# Patient Record
Sex: Male | Born: 1990 | Race: White | Hispanic: Yes | Marital: Married | Smoking: Light tobacco smoker
Health system: Southern US, Community
[De-identification: ages and names within clinical notes are randomized; demographics above are authoritative.]

---

## 2014-01-31 ENCOUNTER — Encounter: Payer: Self-pay | Admitting: Internal Medicine

## 2014-01-31 ENCOUNTER — Ambulatory Visit: Payer: Self-pay | Attending: Internal Medicine | Admitting: Internal Medicine

## 2014-01-31 VITALS — BP 124/85 | HR 66 | Temp 98.2°F | Resp 16 | Ht 67.5 in | Wt 119.0 lb

## 2014-01-31 DIAGNOSIS — F32A Depression, unspecified: Secondary | ICD-10-CM

## 2014-01-31 DIAGNOSIS — F17210 Nicotine dependence, cigarettes, uncomplicated: Secondary | ICD-10-CM | POA: Insufficient documentation

## 2014-01-31 DIAGNOSIS — F419 Anxiety disorder, unspecified: Secondary | ICD-10-CM | POA: Insufficient documentation

## 2014-01-31 DIAGNOSIS — F329 Major depressive disorder, single episode, unspecified: Secondary | ICD-10-CM | POA: Insufficient documentation

## 2014-01-31 MED ORDER — CLONAZEPAM 0.5 MG PO TABS: 1.0000 mg | ORAL_TABLET | Freq: Two times a day (BID) | ORAL | Status: AC | PRN

## 2014-01-31 MED ORDER — CITALOPRAM HYDROBROMIDE 20 MG PO TABS: 20.0000 mg | ORAL_TABLET | Freq: Every day | ORAL | Status: AC

## 2014-01-31 NOTE — Progress Notes (Signed)
Patient ID: Jerry Phillips, male   DOB: 1991/01/05, 23 y.o.   MRN: [id number]  BJY:[id number]  [id number]  DOB - 1991/01/05  CC:  Chief Complaint  Patient presents with  . Establish Care       HPI: Jerry SayresSergio Phillips is a 23 y.o. male here today to establish medical care.  Patient has a past medical history of depression/anxiety and is requesting refills of his Celexa today.  He was diagnosed with depression four months ago [place].  He reports that he was having symptoms of anxiety, responding quickly, and feelings of anxiety in crowds. He reports that he gets very anxious when he sees crowds of people at at any location.  He has feelings of worthless and hopelessness.  He denies SI and HI.  The Celexa and Klonopin helps to calm his fears and make him feel less anxious.     Patient has No headache, No chest pain, No abdominal pain - No Nausea, No new weakness tingling or numbness, No Cough - SOB.  No Known Allergies History reviewed. No pertinent past medical history. No current outpatient prescriptions on file prior to visit.   No current facility-administered medications on file prior to visit.   Family History  Problem Relation Age of Onset  . Cancer Paternal Grandfather    History   Social History  . Marital Status: Married    Spouse Name: N/A    Number of Children: N/A  . Years of Education: N/A   Occupational History  . Not on file.   Social History Main Topics  . Smoking status: Light Tobacco Smoker  . Smokeless tobacco: Not on file  . Alcohol Use: 1.2 oz/week    2 Not specified per week  . Drug Use: No  . Sexual Activity: Yes   Other Topics Concern  . Not on file   Social History Narrative  . No narrative on file    Review of Systems  Psychiatric/Behavioral: Positive for depression. Negative for suicidal ideas and substance abuse. The patient is nervous/anxious.   All other systems reviewed and are negative.  .     Objective:   Filed Vitals:   01/31/14 0959  BP: 124/85  Pulse: 66  Temp: 98.2 F (36.8 C)  Resp: 16    Physical Exam  Constitutional: He is oriented to person, place, and time.  Neck: No thyromegaly present.  Cardiovascular: Normal rate, regular rhythm and normal heart sounds.   Pulmonary/Chest: Effort normal and breath sounds normal.  Abdominal: Soft. Bowel sounds are normal.  Lymphadenopathy:    He has no cervical adenopathy.  Neurological: He is alert and oriented to person, place, and time.  Skin: Skin is warm and dry.  Psychiatric: He has a normal mood and affect.     No results found for: WBC, HGB, HCT, MCV, PLT No results found for: CREATININE, BUN, NA, K, CL, CO2  No results found for: HGBA1C Lipid Panel  No results found for: CHOL, TRIG, HDL, CHOLHDL, VLDL, LDLCALC     Assessment and plan:   Jerry Phillips was seen today for establish care.  Diagnoses and associated orders for this visit:  Depression/Anxiety - citalopram (CELEXA) 20 MG tablet; Take 1 tablet (20 mg total) by mouth daily. - clonazePAM (KLONOPIN) 0.5 MG tablet; Take 2 tablets (1 mg total) by mouth 2 (two) times daily as needed for anxiety. Discussed with patient that anxiety symptoms are the body's response to stress.  Discussed that benzo's are  habit forming and should only be used when symptoms are severe and this will not be a cyclic fill drug. I have therefore increased the SSRI in hopes that patient will find less need for klonopin.  [facility]  Will follow up with patient in 4 weeks to determine further needs.    Return in about 2 months (around 04/02/2014) for depression f/u.    Holland CommonsKECK, VALERIE, NP-C [facility] [phone] 01/31/2014, 10:35 AM

## 2014-01-31 NOTE — Progress Notes (Signed)
Pt is here today to establish care. Pt is here to refill his medications. Pt has a history of depression and anxiety.

## 2014-01-31 NOTE — Patient Instructions (Signed)
I have increased your citalopram to 20 mg daily with hopes that this will decrease the need for Klonopin.  I have given you information to a counselor. I believe this will help with you social anxiety.  The plan is to wean you off Klonopin unless the counselor feels like you need to continue the medication.  It was nice to meet you today.

## 2014-02-03 ENCOUNTER — Encounter: Payer: Self-pay | Admitting: Internal Medicine

## 2015-06-23 ENCOUNTER — Ambulatory Visit: Payer: Self-pay | Admitting: Internal Medicine

## 2016-05-04 ENCOUNTER — Encounter (HOSPITAL_COMMUNITY): Payer: Self-pay | Admitting: Emergency Medicine

## 2016-05-04 ENCOUNTER — Emergency Department (HOSPITAL_COMMUNITY)
Admission: EM | Admit: 2016-05-04 | Discharge: 2016-05-04 | Disposition: A | Discharge status: Home / Self Care | Payer: Self-pay | Attending: Emergency Medicine | Admitting: Emergency Medicine

## 2016-05-04 DIAGNOSIS — M436 Torticollis: Secondary | ICD-10-CM | POA: Insufficient documentation

## 2016-05-04 DIAGNOSIS — F17210 Nicotine dependence, cigarettes, uncomplicated: Secondary | ICD-10-CM | POA: Insufficient documentation

## 2016-05-04 MED ORDER — IBUPROFEN 600 MG PO TABS: 600.0000 mg | ORAL_TABLET | Freq: Four times a day (QID) | ORAL | 0 refills | Status: AC | PRN

## 2016-05-04 MED ORDER — METHOCARBAMOL 500 MG PO TABS: 500.0000 mg | ORAL_TABLET | Freq: Two times a day (BID) | ORAL | 0 refills | Status: AC

## 2016-05-04 NOTE — ED Triage Notes (Signed)
Patient woke up with neck pain. patient holding neck to left side. Patient having to turn whole body due to pain.

## 2016-05-04 NOTE — Discharge Instructions (Signed)
Take Ibuprofen for the next week. Take this medicine with food. Take muscle relaxer at bedtime to help with muscle spasm. This medicine makes you drowsy so do not take before driving or work Use a heating pad for sore muscles - use for 20 minutes several times a day

## 2016-05-04 NOTE — ED Provider Notes (Signed)
[facility]-EMERGENCY DEPT Provider Note   CSN: [id number] Arrival date & time: 05/04/16  1017   By signing my name below, I, Avnee Patel, attest that this documentation has been prepared under the direction and in the presence of  [employer], PA-C. Electronically Signed: Clovis Pu, ED Scribe. 05/04/16. 10:55 AM.  History   Chief Complaint Chief Complaint  Patient presents with  . Neck Pain    The history is provided by the patient. No language interpreter was used.   HPI Comments:  Jerry Phillips is a 26 y.o. male who presents to the Emergency Department complaining of right sided neck pain onset today. He also reports intermittent dizziness which caused one episode of nausea and vomiting. Pt states he awoke with this pain. He has applied Biofreeze PTA with no significant relief. He has had this pain before but never this severe and it always resolved on its own. Pt denies current dizziness, fevers, chills, body aches, visual disturbances, weakness, numbness, any recent injuries/falls/MVCs or any other associated symptoms. No PCP noted.    History reviewed. No pertinent past medical history.  There are no active problems to display for this patient.   History reviewed. No pertinent surgical history.  Home Medications    Prior to Admission medications   Medication Sig Start Date End Date Taking? Authorizing Provider  citalopram (CELEXA) 20 MG tablet Take 1 tablet (20 mg total) by mouth daily. 01/31/14   Ambrose Finland, NP  clonazePAM (KLONOPIN) 0.5 MG tablet Take 2 tablets (1 mg total) by mouth 2 (two) times daily as needed for anxiety. 01/31/14   Ambrose Finland, NP  ibuprofen (ADVIL,MOTRIN) 600 MG tablet Take 1 tablet (600 mg total) by mouth every 6 (six) hours as needed. 05/04/16   Bethel Born, PA-C  methocarbamol (ROBAXIN) 500 MG tablet Take 1 tablet (500 mg total) by mouth 2 (two) times daily. 05/04/16   Bethel Born, PA-C  Probiotic Product (PROBIOTIC DAILY PO) Take  by mouth.    Historical Provider, MD    Family History Family History  Problem Relation Age of Onset  . Cancer Paternal Grandfather     Social History Social History  Substance Use Topics  . Smoking status: Light Tobacco Smoker    Types: Cigarettes  . Smokeless tobacco: Never Used  . Alcohol use 1.2 oz/week    2 Standard drinks or equivalent per week     Allergies   Patient has no known allergies.   Review of Systems Review of Systems  Constitutional: Negative for chills and fever.  Eyes: Negative for visual disturbance.  Gastrointestinal: Positive for vomiting.  Musculoskeletal: Positive for myalgias and neck pain.  Neurological: Positive for dizziness. Negative for weakness and numbness.   Physical Exam Updated Vital Signs BP 110/74 (BP Location: Left Arm)   Pulse 82   Temp 97.8 F (36.6 C) (Oral)   Resp 18   SpO2 100%   Physical Exam  Constitutional: He is oriented to person, place, and time. He appears well-developed and well-nourished. He appears distressed (mildly).  HENT:  Head: Normocephalic and atraumatic.  Eyes: Conjunctivae and EOM are normal. Pupils are equal, round, and reactive to light.  Neck:  held in lateral flexion to the left.   Cardiovascular: Normal rate.   Pulmonary/Chest: Effort normal.  Abdominal: He exhibits no distension.  Musculoskeletal: He exhibits tenderness.  Tenderness over right lateral side of neck   Neurological: He is alert and oriented to person, place, and time.  Cranial  nerves grossly intact. Normal muscle tone. 5/5 strength in upper and lower extremities. Normal gait.   Skin: Skin is warm and dry.  Psychiatric: He has a normal mood and affect.  Nursing note and vitals reviewed.  ED Treatments / Results  DIAGNOSTIC STUDIES:  Oxygen Saturation is 100% on RA, normal by my interpretation.    COORDINATION OF CARE:  10:48 AM Discussed treatment plan with pt at bedside and pt agreed to plan.  Labs (all labs ordered  are listed, but only abnormal results are displayed) Labs Reviewed - No data to display  EKG  EKG Interpretation None       Radiology No results found.  Procedures Procedures (including critical care time)  Medications Ordered in ED Medications - No data to display   Initial Impression / Assessment and Plan / ED Course  I have reviewed the triage vital signs and the nursing notes.  Pertinent labs & imaging results that were available during my care of the patient were reviewed by me and considered in my medical decision making (see chart for details).  26 year old male presents with torticollis. Vitals are normal. Neuro exam is normal. Discussed with patient that this is self-limiting and will treat with Ibuprofen and muscle relaxer for comfort. Return precautions discussed.   Final Clinical Impressions(s) / ED Diagnoses   Final diagnoses:  None    New Prescriptions New Prescriptions   No medications on file   I personally performed the services described in this documentation, which was scribed in my presence. The recorded information has been reviewed and is accurate.     Bethel BornKelly Marie Zanna Hawn, PA-C 05/04/16 1113    Laurence Spatesachel Morgan Little, MD 05/04/16 [phone]

## 2018-12-17 ENCOUNTER — Emergency Department (HOSPITAL_COMMUNITY)
Admission: EM | Admit: 2018-12-17 | Discharge: 2018-12-18 | Disposition: A | Discharge status: Home / Self Care | Payer: Self-pay | Attending: Emergency Medicine | Admitting: Emergency Medicine

## 2018-12-17 ENCOUNTER — Emergency Department (HOSPITAL_COMMUNITY): Payer: Self-pay

## 2018-12-17 ENCOUNTER — Other Ambulatory Visit: Payer: Self-pay

## 2018-12-17 ENCOUNTER — Encounter (HOSPITAL_COMMUNITY): Payer: Self-pay | Admitting: Emergency Medicine

## 2018-12-17 DIAGNOSIS — Y999 Unspecified external cause status: Secondary | ICD-10-CM | POA: Insufficient documentation

## 2018-12-17 DIAGNOSIS — S60511A Abrasion of right hand, initial encounter: Secondary | ICD-10-CM | POA: Insufficient documentation

## 2018-12-17 DIAGNOSIS — S60512A Abrasion of left hand, initial encounter: Secondary | ICD-10-CM | POA: Insufficient documentation

## 2018-12-17 DIAGNOSIS — S01111A Laceration without foreign body of right eyelid and periocular area, initial encounter: Secondary | ICD-10-CM | POA: Insufficient documentation

## 2018-12-17 DIAGNOSIS — S022XXA Fracture of nasal bones, initial encounter for closed fracture: Secondary | ICD-10-CM | POA: Insufficient documentation

## 2018-12-17 DIAGNOSIS — F10929 Alcohol use, unspecified with intoxication, unspecified: Secondary | ICD-10-CM | POA: Insufficient documentation

## 2018-12-17 DIAGNOSIS — Y929 Unspecified place or not applicable: Secondary | ICD-10-CM | POA: Insufficient documentation

## 2018-12-17 DIAGNOSIS — F17210 Nicotine dependence, cigarettes, uncomplicated: Secondary | ICD-10-CM | POA: Insufficient documentation

## 2018-12-17 DIAGNOSIS — I498 Other specified cardiac arrhythmias: Secondary | ICD-10-CM | POA: Insufficient documentation

## 2018-12-17 DIAGNOSIS — F10920 Alcohol use, unspecified with intoxication, uncomplicated: Secondary | ICD-10-CM

## 2018-12-17 DIAGNOSIS — Y939 Activity, unspecified: Secondary | ICD-10-CM | POA: Insufficient documentation

## 2018-12-17 DIAGNOSIS — S0181XA Laceration without foreign body of other part of head, initial encounter: Secondary | ICD-10-CM | POA: Insufficient documentation

## 2018-12-17 DIAGNOSIS — S80211A Abrasion, right knee, initial encounter: Secondary | ICD-10-CM | POA: Insufficient documentation

## 2018-12-17 DIAGNOSIS — Z23 Encounter for immunization: Secondary | ICD-10-CM | POA: Insufficient documentation

## 2018-12-17 DIAGNOSIS — S80212A Abrasion, left knee, initial encounter: Secondary | ICD-10-CM | POA: Insufficient documentation

## 2018-12-17 LAB — CBC
HCT: 43 % (ref 39.0–52.0)
Hemoglobin: 14.3 g/dL (ref 13.0–17.0)
MCH: 30 pg (ref 26.0–34.0)
MCHC: 33.3 g/dL (ref 30.0–36.0)
MCV: 90.3 fL (ref 80.0–100.0)
Platelets: 288 10*3/uL (ref 150–400)
RBC: 4.76 MIL/uL (ref 4.22–5.81)
RDW: 12.7 % (ref 11.5–15.5)
WBC: 13.5 10*3/uL — ABNORMAL HIGH (ref 4.0–10.5)
nRBC: 0 % (ref 0.0–0.2)

## 2018-12-17 LAB — PROTIME-INR
INR: 1.1 (ref 0.8–1.2)
Prothrombin Time: 14.4 seconds (ref 11.4–15.2)

## 2018-12-17 MED ORDER — LIDOCAINE HCL (PF) 1 % IJ SOLN
5.0000 mL | Freq: Once | INTRAMUSCULAR | Status: AC
  Administered 2018-12-17: 5 mL
  Filled 2018-12-17: qty 5

## 2018-12-17 MED ORDER — FENTANYL CITRATE (PF) 100 MCG/2ML IJ SOLN
25.0000 ug | Freq: Once | INTRAMUSCULAR | Status: AC
  Administered 2018-12-17: 25 ug via INTRAVENOUS
  Filled 2018-12-17: qty 2

## 2018-12-17 MED ORDER — TETANUS-DIPHTH-ACELL PERTUSSIS 5-2.5-18.5 LF-MCG/0.5 IM SUSP
0.5000 mL | Freq: Once | INTRAMUSCULAR | Status: AC
  Administered 2018-12-17: 0.5 mL via INTRAMUSCULAR
  Filled 2018-12-17: qty 0.5
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
## 2018-12-17 NOTE — ED Notes (Signed)
Paper bags placed on hands per [employer] request

## 2018-12-17 NOTE — ED Notes (Signed)
Lab called, more tubes need to be sent to lab. Provider at the bedside for suturing. CT updated on delay

## 2018-12-18 ENCOUNTER — Emergency Department (HOSPITAL_COMMUNITY): Payer: Self-pay

## 2018-12-18 LAB — COMPREHENSIVE METABOLIC PANEL
ALT: 22 U/L (ref 0–44)
AST: 27 U/L (ref 15–41)
Albumin: 4.3 g/dL (ref 3.5–5.0)
Alkaline Phosphatase: 59 U/L (ref 38–126)
Anion gap: 13 (ref 5–15)
BUN: 9 mg/dL (ref 6–20)
CO2: 22 mmol/L (ref 22–32)
Calcium: 8.7 mg/dL — ABNORMAL LOW (ref 8.9–10.3)
Chloride: 108 mmol/L (ref 98–111)
Creatinine, Ser: 1.01 mg/dL (ref 0.61–1.24)
GFR calc Af Amer: >60 mL/min (ref >60)
GFR calc non Af Amer: >60 mL/min (ref >60)
Glucose, Bld: 110 mg/dL — ABNORMAL HIGH (ref 70–99)
Potassium: 3.8 mmol/L (ref 3.5–5.1)
Sodium: 143 mmol/L (ref 135–145)
Total Bilirubin: 0.6 mg/dL (ref 0.3–1.2)
Total Protein: 6.9 g/dL (ref 6.5–8.1)

## 2018-12-18 LAB — LACTIC ACID, PLASMA
Lactic Acid, Venous: 2.8 mmol/L (ref 0.5–1.9)
Lactic Acid, Venous: 3.2 mmol/L (ref 0.5–1.9)

## 2018-12-18 LAB — CDS SEROLOGY

## 2018-12-18 LAB — ETHANOL: Alcohol, Ethyl (B): 118 mg/dL — ABNORMAL HIGH (ref <10)

## 2018-12-18 MED ORDER — IOHEXOL 300 MG/ML  SOLN
75.0000 mL | Freq: Once | INTRAMUSCULAR | Status: AC | PRN
  Administered 2018-12-18: 75 mL via INTRAVENOUS

## 2018-12-18 MED ORDER — LACTATED RINGERS IV BOLUS
1000.0000 mL | Freq: Once | INTRAVENOUS | Status: AC
  Administered 2018-12-18: 1000 mL via INTRAVENOUS

## 2018-12-18 NOTE — ED Notes (Signed)
Pt is in radiology for testing

## 2018-12-18 NOTE — ED Notes (Signed)
Bilateral IV's removed, tips intact. Pt given scrubs, verbalized understanding of d/c instructions. Pt was escorted by [employer] in  Handcuffs for discharge.

## 2018-12-18 NOTE — Discharge Instructions (Addendum)
Your work-up in the emergency department showed that you have a fracture to your nasal bone.  This will heal with time.  Take Tylenol or ibuprofen for pain.  Apply ice to your face 3-4 times per day to limit swelling/inflammation.  The stitches placed in your face will need to be removed in 1 week.  This can be done by your primary care doctor or in this emergency department.  Your found to have a slightly abnormal EKG.  This should be followed by a heart doctor/cardiologist.  You have been given a referral to [facility] cardiovascular.  Call to make an appointment to be seen in the office.  You may return to the ED for new or concerning symptoms such as memory loss, weakness in your arms or legs, loss of consciousness.
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
